# Patient Record
Sex: Female | Born: 1960 | Race: White | Hispanic: No | Marital: Married | Smoking: Never smoker
Health system: Southern US, Community
[De-identification: ages and names within clinical notes are randomized; demographics above are authoritative.]

## PROBLEM LIST (undated history)

## (undated) ENCOUNTER — Ambulatory Visit: Payer: No Typology Code available for payment source

## (undated) DIAGNOSIS — K219 Gastro-esophageal reflux disease without esophagitis: Secondary | ICD-10-CM

## (undated) DIAGNOSIS — I10 Essential (primary) hypertension: Secondary | ICD-10-CM

## (undated) HISTORY — PX: ABDOMINAL HYSTERECTOMY: SHX81

---

## 2005-02-07 ENCOUNTER — Ambulatory Visit: Payer: Self-pay | Admitting: Internal Medicine

## 2006-06-12 ENCOUNTER — Ambulatory Visit: Payer: Self-pay | Admitting: Internal Medicine

## 2007-09-03 ENCOUNTER — Ambulatory Visit: Payer: Self-pay | Admitting: Internal Medicine

## 2008-02-18 ENCOUNTER — Ambulatory Visit: Payer: Self-pay | Admitting: Unknown Physician Specialty

## 2008-11-17 ENCOUNTER — Ambulatory Visit: Payer: Self-pay | Admitting: Internal Medicine

## 2009-12-14 ENCOUNTER — Ambulatory Visit: Payer: Self-pay | Admitting: Internal Medicine

## 2011-06-02 ENCOUNTER — Ambulatory Visit: Payer: Self-pay | Admitting: Internal Medicine

## 2011-12-12 ENCOUNTER — Ambulatory Visit: Payer: Self-pay | Admitting: Internal Medicine

## 2012-05-13 ENCOUNTER — Ambulatory Visit: Payer: Self-pay | Admitting: Podiatry

## 2013-02-18 ENCOUNTER — Ambulatory Visit: Payer: Self-pay

## 2013-04-22 ENCOUNTER — Ambulatory Visit: Payer: Self-pay | Admitting: Medical

## 2013-04-22 LAB — URINALYSIS, COMPLETE
Bilirubin,UR: NEGATIVE
Glucose,UR: NEGATIVE mg/dL (ref 0–75)
Ketone: NEGATIVE
Nitrite: NEGATIVE
WBC UR: >30 /HPF (ref 0–5)

## 2013-04-24 LAB — URINE CULTURE

## 2013-07-22 ENCOUNTER — Ambulatory Visit: Payer: Self-pay | Admitting: Unknown Physician Specialty

## 2013-07-25 LAB — PATHOLOGY REPORT

## 2014-05-26 ENCOUNTER — Ambulatory Visit: Payer: Self-pay

## 2016-02-19 ENCOUNTER — Other Ambulatory Visit: Payer: Self-pay | Admitting: Infectious Diseases

## 2016-02-19 DIAGNOSIS — Z1231 Encounter for screening mammogram for malignant neoplasm of breast: Secondary | ICD-10-CM

## 2016-03-14 ENCOUNTER — Encounter (HOSPITAL_COMMUNITY): Payer: Self-pay

## 2016-03-14 ENCOUNTER — Ambulatory Visit
Admission: RE | Admit: 2016-03-14 | Discharge: 2016-03-14 | Disposition: A | Discharge status: Home / Self Care | Payer: Managed Care, Other (non HMO) | Source: Ambulatory Visit | Attending: Infectious Diseases | Admitting: Infectious Diseases

## 2016-03-14 DIAGNOSIS — Z1231 Encounter for screening mammogram for malignant neoplasm of breast: Secondary | ICD-10-CM | POA: Diagnosis not present

## 2017-03-11 ENCOUNTER — Other Ambulatory Visit: Payer: Self-pay | Admitting: Infectious Diseases

## 2017-03-11 DIAGNOSIS — Z1231 Encounter for screening mammogram for malignant neoplasm of breast: Secondary | ICD-10-CM

## 2017-04-17 ENCOUNTER — Ambulatory Visit
Admission: RE | Admit: 2017-04-17 | Discharge: 2017-04-17 | Disposition: A | Discharge status: Home / Self Care | Payer: Managed Care, Other (non HMO) | Source: Ambulatory Visit | Attending: Infectious Diseases | Admitting: Infectious Diseases

## 2017-04-17 DIAGNOSIS — Z1231 Encounter for screening mammogram for malignant neoplasm of breast: Secondary | ICD-10-CM | POA: Diagnosis not present

## 2018-04-13 ENCOUNTER — Other Ambulatory Visit: Payer: Self-pay | Admitting: Internal Medicine

## 2018-04-13 DIAGNOSIS — Z1231 Encounter for screening mammogram for malignant neoplasm of breast: Secondary | ICD-10-CM

## 2018-05-07 ENCOUNTER — Ambulatory Visit
Admission: RE | Admit: 2018-05-07 | Discharge: 2018-05-07 | Disposition: A | Discharge status: Home / Self Care | Payer: Managed Care, Other (non HMO) | Source: Ambulatory Visit | Attending: Internal Medicine | Admitting: Internal Medicine

## 2018-05-07 DIAGNOSIS — Z1231 Encounter for screening mammogram for malignant neoplasm of breast: Secondary | ICD-10-CM | POA: Diagnosis not present

## 2019-04-01 ENCOUNTER — Other Ambulatory Visit: Payer: Self-pay | Admitting: Internal Medicine

## 2019-04-01 DIAGNOSIS — Z1231 Encounter for screening mammogram for malignant neoplasm of breast: Secondary | ICD-10-CM

## 2019-05-10 ENCOUNTER — Ambulatory Visit
Admission: RE | Admit: 2019-05-10 | Discharge: 2019-05-10 | Disposition: A | Discharge status: Home / Self Care | Payer: Managed Care, Other (non HMO) | Source: Ambulatory Visit | Attending: Internal Medicine | Admitting: Internal Medicine

## 2019-05-10 DIAGNOSIS — Z1231 Encounter for screening mammogram for malignant neoplasm of breast: Secondary | ICD-10-CM | POA: Diagnosis present

## 2019-05-19 ENCOUNTER — Other Ambulatory Visit: Payer: Self-pay | Admitting: Physician Assistant

## 2019-05-19 DIAGNOSIS — R1011 Right upper quadrant pain: Secondary | ICD-10-CM

## 2019-05-27 ENCOUNTER — Other Ambulatory Visit: Payer: Self-pay

## 2019-05-27 ENCOUNTER — Ambulatory Visit
Admission: RE | Admit: 2019-05-27 | Discharge: 2019-05-27 | Disposition: A | Discharge status: Home / Self Care | Payer: No Typology Code available for payment source | Source: Ambulatory Visit | Attending: Physician Assistant | Admitting: Physician Assistant

## 2019-05-27 DIAGNOSIS — R1011 Right upper quadrant pain: Secondary | ICD-10-CM | POA: Insufficient documentation

## 2019-10-01 ENCOUNTER — Other Ambulatory Visit
Admission: RE | Admit: 2019-10-01 | Discharge: 2019-10-01 | Disposition: A | Discharge status: Home / Self Care | Payer: 59 | Source: Ambulatory Visit | Attending: Student | Admitting: Student

## 2019-10-01 DIAGNOSIS — K5150 Left sided colitis without complications: Secondary | ICD-10-CM | POA: Insufficient documentation

## 2019-10-04 LAB — CALPROTECTIN, FECAL: Calprotectin, Fecal: 43 ug/g (ref 0–120)

## 2020-06-18 ENCOUNTER — Other Ambulatory Visit: Payer: Self-pay | Admitting: Infectious Diseases

## 2020-06-18 DIAGNOSIS — Z1231 Encounter for screening mammogram for malignant neoplasm of breast: Secondary | ICD-10-CM

## 2020-07-06 ENCOUNTER — Ambulatory Visit
Admission: RE | Admit: 2020-07-06 | Discharge: 2020-07-06 | Disposition: A | Discharge status: Home / Self Care | Payer: No Typology Code available for payment source | Source: Ambulatory Visit | Attending: Infectious Diseases | Admitting: Infectious Diseases

## 2020-07-06 ENCOUNTER — Other Ambulatory Visit: Payer: Self-pay

## 2020-07-06 DIAGNOSIS — Z1231 Encounter for screening mammogram for malignant neoplasm of breast: Secondary | ICD-10-CM | POA: Insufficient documentation

## 2020-12-28 ENCOUNTER — Ambulatory Visit
Admission: EM | Admit: 2020-12-28 | Discharge: 2020-12-28 | Disposition: A | Discharge status: Home / Self Care | Payer: No Typology Code available for payment source | Attending: Family Medicine | Admitting: Family Medicine

## 2020-12-28 ENCOUNTER — Other Ambulatory Visit: Payer: Self-pay

## 2020-12-28 ENCOUNTER — Encounter: Payer: Self-pay | Admitting: Emergency Medicine

## 2020-12-28 DIAGNOSIS — R21 Rash and other nonspecific skin eruption: Secondary | ICD-10-CM | POA: Diagnosis not present

## 2020-12-28 HISTORY — DX: Essential (primary) hypertension: I10

## 2020-12-28 HISTORY — DX: Gastro-esophageal reflux disease without esophagitis: K21.9

## 2020-12-28 MED ORDER — HYDROXYZINE HCL 25 MG PO TABS: 25.0000 mg | ORAL_TABLET | Freq: Three times a day (TID) | ORAL | 0 refills | Status: AC | PRN

## 2020-12-28 MED ORDER — PREDNISONE 10 MG PO TABS: ORAL_TABLET | ORAL | 0 refills | Status: AC

## 2020-12-28 NOTE — ED Provider Notes (Signed)
[facility]    CSN: [id number] Arrival date & time: 12/28/20  1148      History   Chief Complaint Chief Complaint  Patient presents with   Rash    HPI 60 year old female presents with rash.  Patient has had intermittent rash since February of this year.  She has been evaluated by dermatology as well as allergy.  She has been prescribed steroids on 3 occasions with improvement but no complete resolution.  She has stopped medication as well as changed her care products without resolution.  She states that she has had recurrent rash again since Sunday or Monday of this week.  It is located predominantly on the back.  It is very pruritic and is raised and erythematous.  Patient reports diffuse itching.  She continues to t take Zyrtec and Benadryl without resolution.  She is very perplexed by her symptoms and is very uncomfortable.  She is unhappy that this continues to be a problem despite all of her interventions and seeing an allergist and dermatologist.    Past Medical History:  Diagnosis Date   GERD (gastroesophageal reflux disease)    Hypertension    Past Surgical History:  Procedure Laterality Date   ABDOMINAL HYSTERECTOMY      OB History   No obstetric history on file.      Home Medications    Prior to Admission medications   Medication Sig Start Date End Date Taking? Authorizing Provider  Cetirizine HCl 10 MG CAPS Take by mouth.   Yes [provider]  hydrOXYzine (ATARAX/VISTARIL) 25 MG tablet Take 1 tablet (25 mg total) by mouth every 8 (eight) hours as needed. 12/28/20  Yes Tommie Sams, DO  Multiple Vitamin (MULTIVITAMIN) capsule Take 1 capsule by mouth daily.   Yes [provider]  olmesartan-hydrochlorothiazide (BENICAR HCT) 20-12.5 MG tablet Take 1 tablet by mouth daily. 12/25/20  Yes [provider]  omeprazole (PRILOSEC) 20 MG capsule Take by mouth. 06/08/14  Yes [provider]  predniSONE (DELTASONE) 10 MG tablet  50 mg daily x 3 days, then 40 mg daily x 3 days, then 30 mg daily x 3 days, then 20 mg daily x 3 days, then 10 mg daily x 3 days. 12/28/20  Yes Jacksen Isip G, DO  sertraline (ZOLOFT) 25 MG tablet Take 25 mg by mouth daily. 10/12/20  Yes [provider]    Family History Family History  Problem Relation Age of Onset   Breast cancer Neg Hx     Social History Social History   Tobacco Use   Smoking status: Never   Smokeless tobacco: Never  Vaping Use   Vaping Use: Never used  Substance Use Topics   Alcohol use: Yes   Drug use: Never     Allergies   Patient has no known allergies.   Review of Systems Review of Systems  Constitutional: Negative.   Skin:  Positive for rash.   Physical Exam Triage Vital Signs ED Triage Vitals  Enc Vitals Group     BP 12/28/20 1202 139/83     Pulse Rate 12/28/20 1202 74     Resp 12/28/20 1202 14     Temp 12/28/20 1202 97.6 F (36.4 C)     Temp Source 12/28/20 1202 Oral     SpO2 12/28/20 1202 98 %     Weight 12/28/20 1158 140 lb (63.5 kg)     Height 12/28/20 1158 5\' 3"  (1.6 m)     Head Circumference --  Peak Flow --      Pain Score 12/28/20 1158 0     Pain Loc --      Pain Edu? --      Excl. in GC? --    Updated Vital Signs BP 139/83 (BP Location: Left Arm)   Pulse 74   Temp 97.6 F (36.4 C) (Oral)   Resp 14   Ht 5\' 3"  (1.6 m)   Wt 63.5 kg   SpO2 98%   BMI 24.80 kg/m   Visual Acuity Right Eye Distance:   Left Eye Distance:   Bilateral Distance:    Right Eye Near:   Left Eye Near:    Bilateral Near:     Physical Exam Constitutional:      General: She is not in acute distress.    Appearance: Normal appearance. She is not ill-appearing.  HENT:     Head: Normocephalic and atraumatic.  Pulmonary:     Effort: Pulmonary effort is normal. No respiratory distress.  Skin:    Comments: Upper back with diffuse erythematous papular rash.  Neurological:     Mental Status: She is alert.  Psychiatric:         Mood and Affect: Mood normal.        Behavior: Behavior normal.     UC Treatments / Results  Labs (all labs ordered are listed, but only abnormal results are displayed) Labs Reviewed - No data to display  EKG   Radiology No results found.  Procedures Procedures (including critical care time)  Medications Ordered in UC Medications - No data to display  Initial Impression / Assessment and Plan / UC Course  I have reviewed the triage vital signs and the nursing notes.  Pertinent labs & imaging results that were available during my care of the patient were reviewed by me and considered in my medical decision making (see chart for details).    60 year old female presents with ongoing, intermittent rash.  Her exam today is notable for an erythematous, raised rash throughout her upper back.  It is very pruritic.  Placing on 15-day prednisone taper.  Advised Allegra daily.  Atarax as prescribed.  Information given for dermatology.  She needs a biopsy for further evaluation.  Final Clinical Impressions(s) / UC Diagnoses   Final diagnoses:  Rash     Discharge Instructions      Medication as prescribed.  Call [facility] for an appt. You need a biopsy of the rash. [phone]  Take care  Dr. [phone]    ED Prescriptions     Medication Sig Dispense Auth. Provider   predniSONE (DELTASONE) 10 MG tablet 50 mg daily x 3 days, then 40 mg daily x 3 days, then 30 mg daily x 3 days, then 20 mg daily x 3 days, then 10 mg daily x 3 days. 45 tablet Josip Merolla G, DO   hydrOXYzine (ATARAX/VISTARIL) 25 MG tablet Take 1 tablet (25 mg total) by mouth every 8 (eight) hours as needed. 30 tablet 06-23-2003, DO      PDMP not reviewed this encounter.   Tommie Sams, Tommie Sams 12/28/20 1323

## 2020-12-28 NOTE — Discharge Instructions (Addendum)
Medication as prescribed.  Call [facility] for an appt. You need a biopsy of the rash. [phone]  Take care  Dr. Adriana Simas

## 2020-12-28 NOTE — ED Triage Notes (Signed)
Patient reports rash that started on her back over a week ago.  Patient reports itching all over.

## 2021-08-01 ENCOUNTER — Other Ambulatory Visit: Payer: Self-pay | Admitting: Infectious Diseases

## 2021-09-13 ENCOUNTER — Ambulatory Visit
Admission: RE | Admit: 2021-09-13 | Discharge: 2021-09-13 | Disposition: A | Discharge status: Home / Self Care | Payer: No Typology Code available for payment source | Source: Ambulatory Visit | Attending: Infectious Diseases | Admitting: Infectious Diseases

## 2021-09-13 DIAGNOSIS — Z1231 Encounter for screening mammogram for malignant neoplasm of breast: Secondary | ICD-10-CM | POA: Insufficient documentation

## 2022-08-20 ENCOUNTER — Other Ambulatory Visit: Payer: Self-pay

## 2022-08-20 DIAGNOSIS — Z1231 Encounter for screening mammogram for malignant neoplasm of breast: Secondary | ICD-10-CM

## 2022-09-19 ENCOUNTER — Ambulatory Visit
Admission: RE | Admit: 2022-09-19 | Discharge: 2022-09-19 | Disposition: A | Discharge status: Home / Self Care | Payer: No Typology Code available for payment source | Source: Ambulatory Visit | Attending: Infectious Diseases | Admitting: Infectious Diseases

## 2022-09-19 DIAGNOSIS — Z1231 Encounter for screening mammogram for malignant neoplasm of breast: Secondary | ICD-10-CM | POA: Insufficient documentation

## 2023-10-27 ENCOUNTER — Other Ambulatory Visit: Payer: Self-pay | Admitting: Infectious Diseases

## 2023-10-27 DIAGNOSIS — Z1231 Encounter for screening mammogram for malignant neoplasm of breast: Secondary | ICD-10-CM

## 2023-11-06 ENCOUNTER — Ambulatory Visit
Admission: RE | Admit: 2023-11-06 | Discharge: 2023-11-06 | Disposition: A | Discharge status: Home / Self Care | Source: Ambulatory Visit | Attending: Infectious Diseases | Admitting: Infectious Diseases

## 2023-11-06 DIAGNOSIS — Z1231 Encounter for screening mammogram for malignant neoplasm of breast: Secondary | ICD-10-CM | POA: Diagnosis present

## 2024-01-02 IMAGING — MG MM DIGITAL SCREENING BILAT W/ TOMO AND CAD
8 series · 9 of 24 positions shown · non-contrast
Comparison: Previous exam(s).

CLINICAL DATA: Screening.

EXAM:
DIGITAL SCREENING BILATERAL MAMMOGRAM WITH TOMOSYNTHESIS AND CAD
TECHNIQUE: Bilateral screening digital craniocaudal and mediolateral oblique
mammograms were obtained. Bilateral screening digital breast
tomosynthesis was performed. The images were evaluated with
computer-aided detection.

[R CC synth-2D]
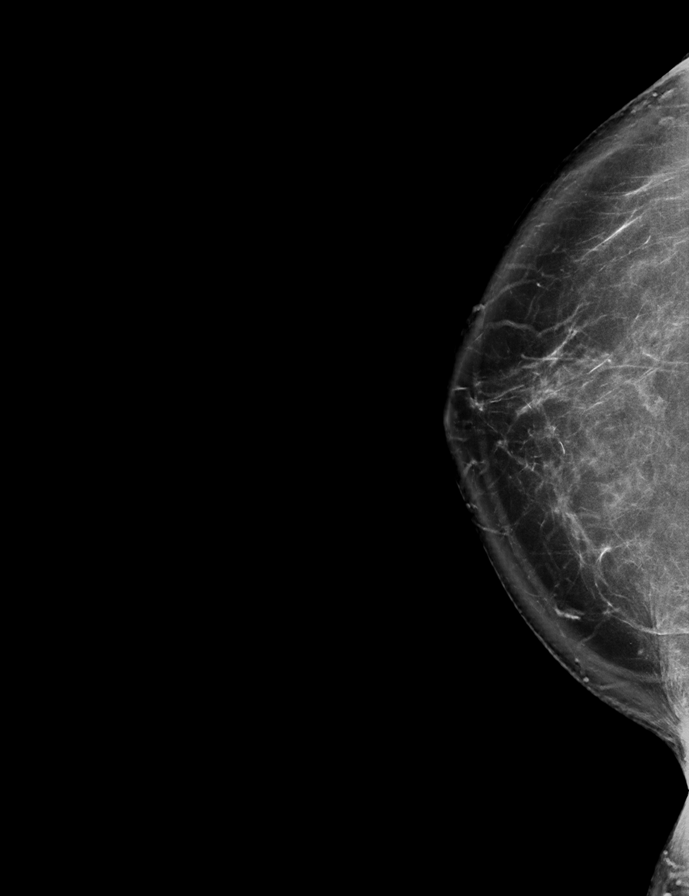

[R MLO synth-2D]
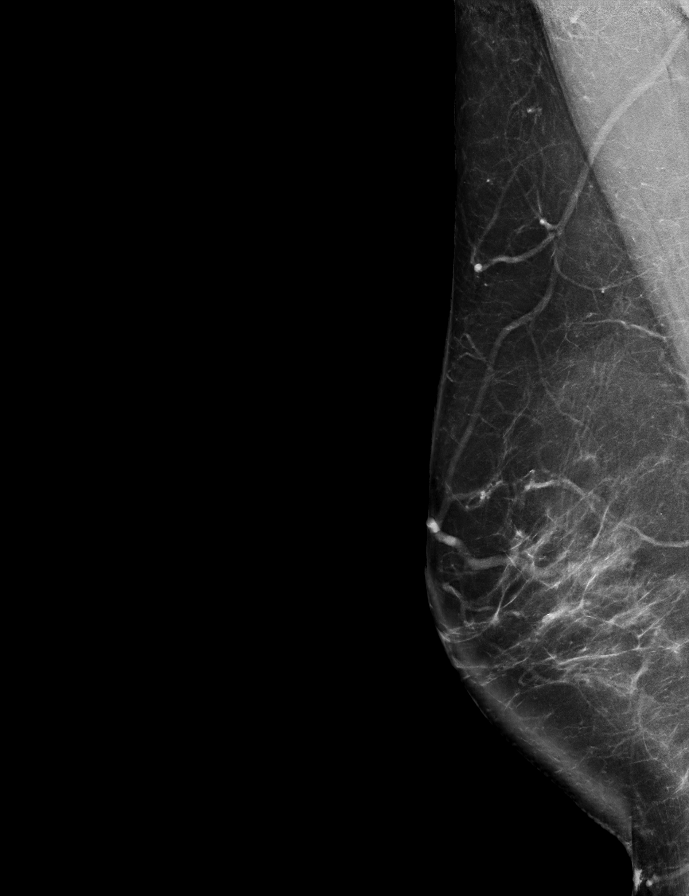

[L MLO synth-2D]
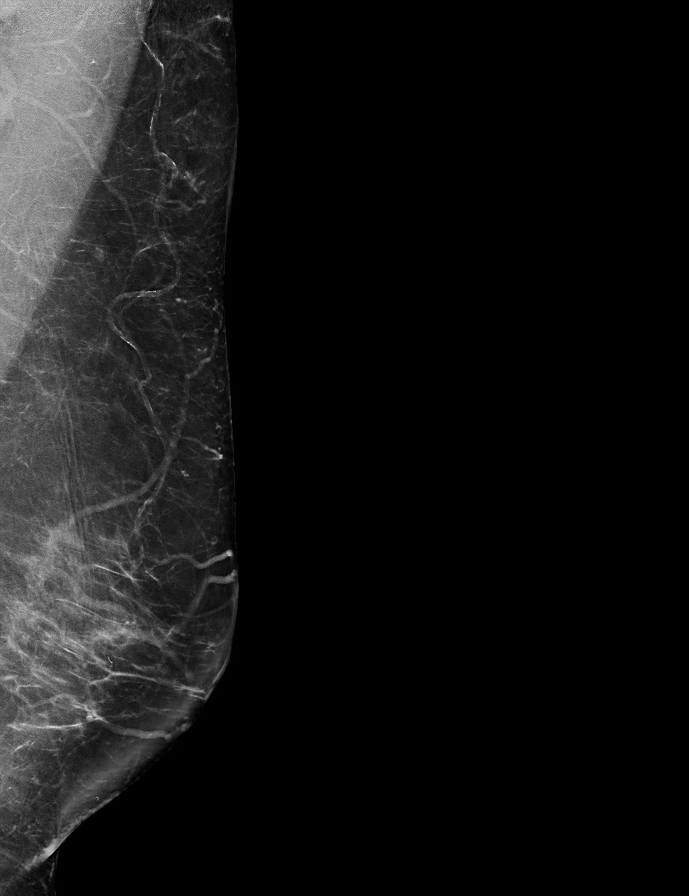

[L CC synth-2D]
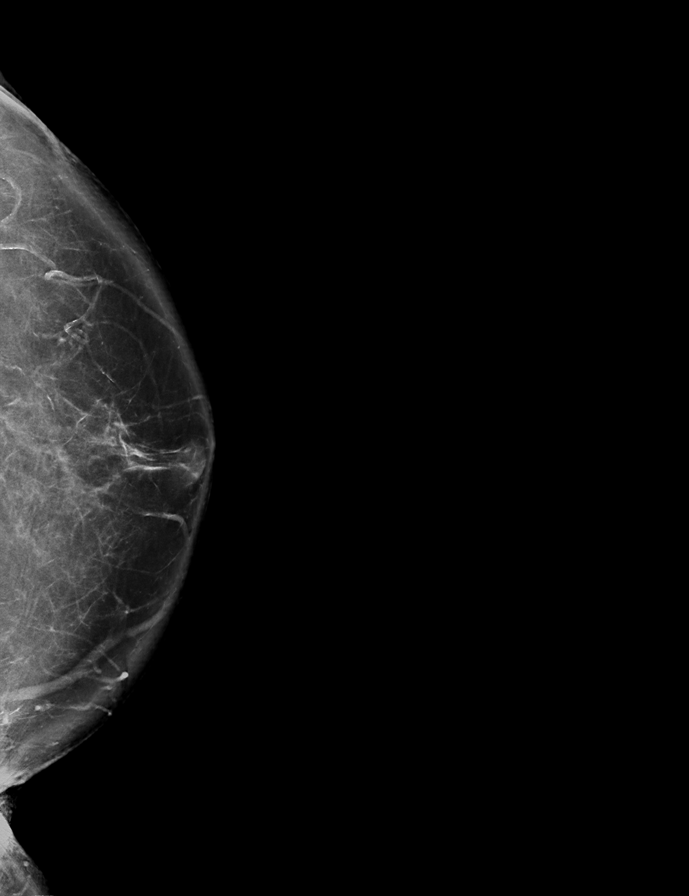

[R CC tomo · 2 of 90 frames shown]
[frame 29/90]
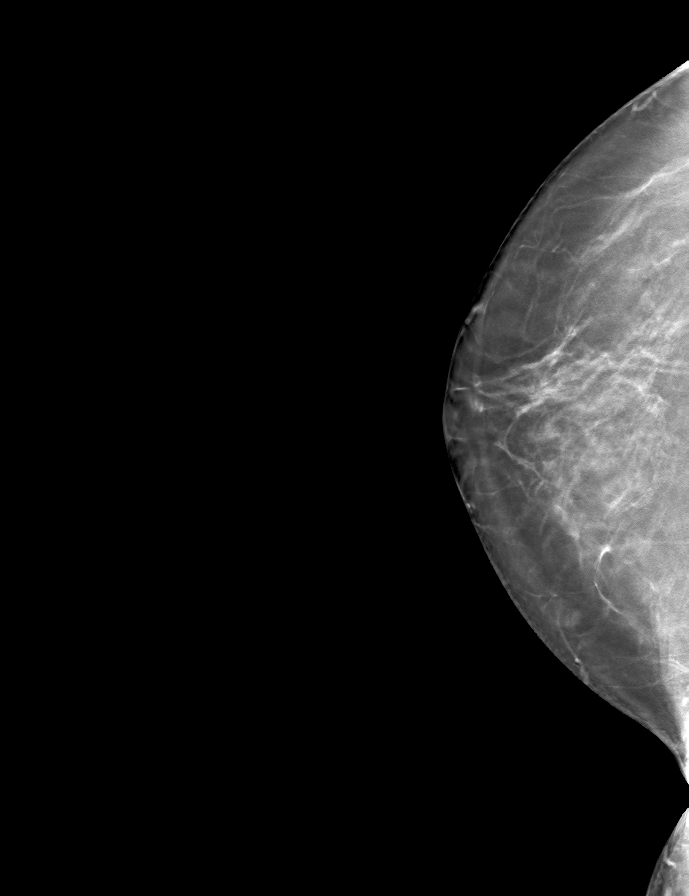
[frame 45/90]
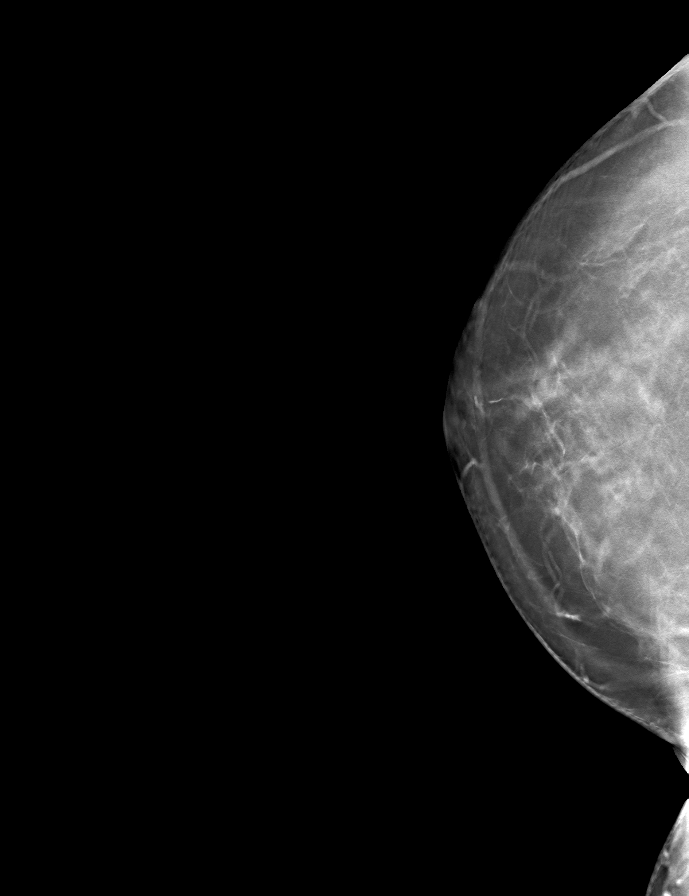

[L CC tomo · tomo slice 46/91.0]
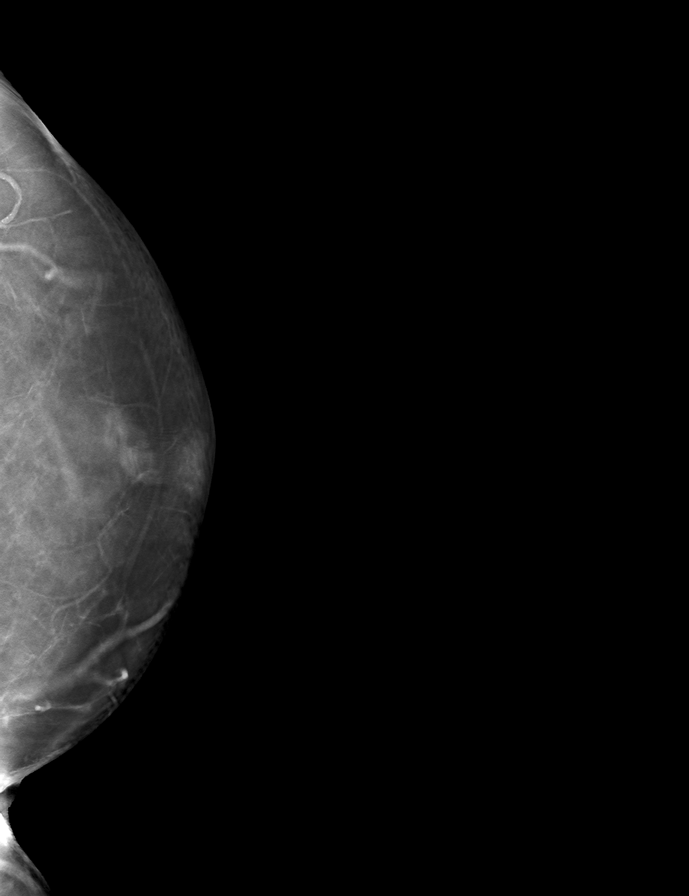

[R MLO tomo · tomo slice 45/89.0]
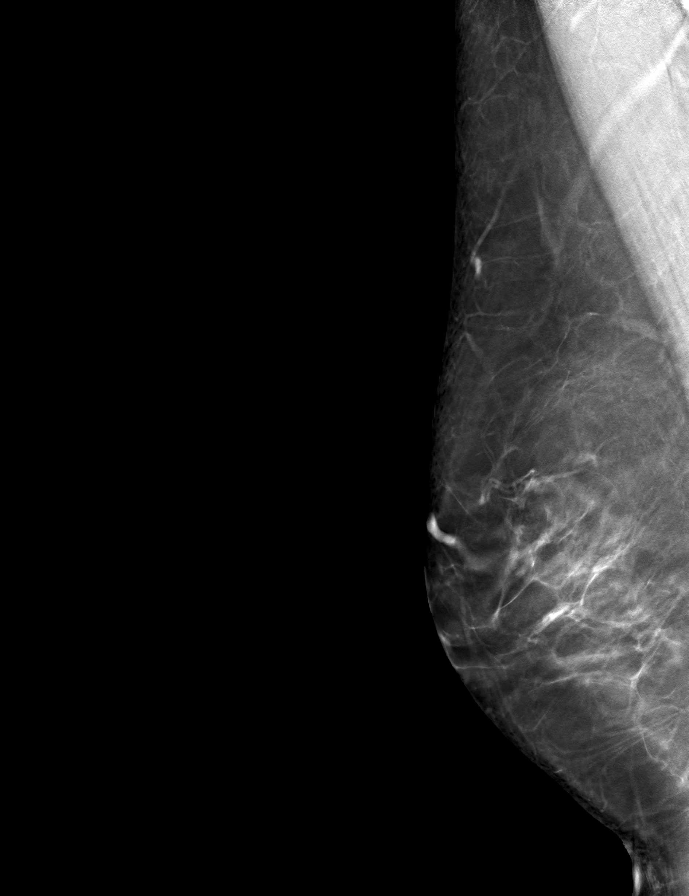

[L MLO tomo · tomo slice 43/85.0]
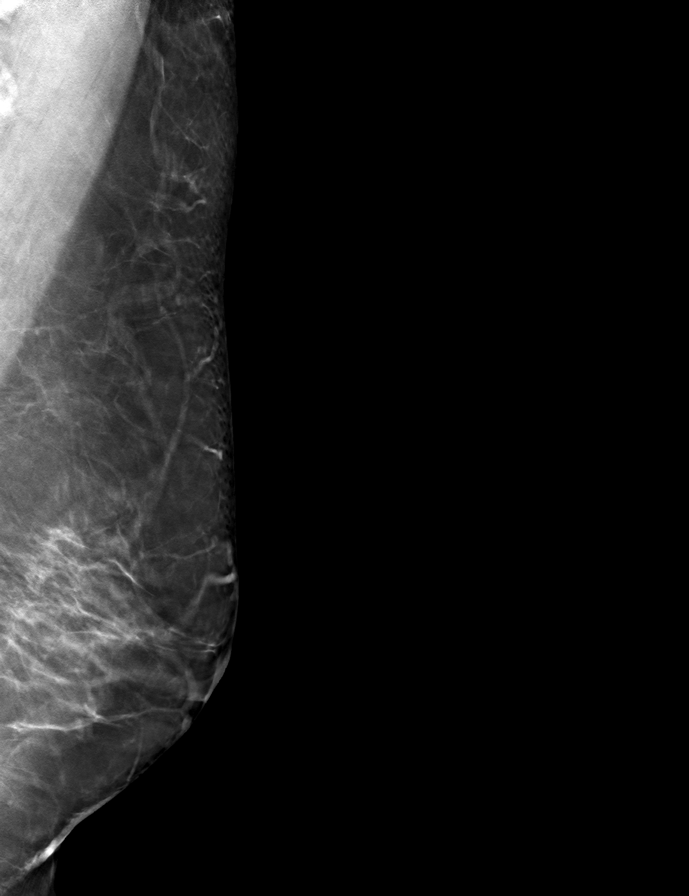

[9 of 24 positions shown; findings below may reference images not displayed]

ACR Breast Density Category b: There are scattered areas of
fibroglandular density.
FINDINGS: There are no findings suspicious for malignancy.
IMPRESSION: No mammographic evidence of malignancy. A result letter of this
screening mammogram will be mailed directly to the patient.

RECOMMENDATION:
Screening mammogram in one year. (Code:51-O-LD2)

BI-RADS CATEGORY  1: Negative.

## 2024-01-14 ENCOUNTER — Other Ambulatory Visit: Payer: Self-pay | Admitting: Infectious Diseases

## 2024-01-14 DIAGNOSIS — L501 Idiopathic urticaria: Secondary | ICD-10-CM

## 2024-01-14 DIAGNOSIS — Z0000 Encounter for general adult medical examination without abnormal findings: Secondary | ICD-10-CM

## 2024-01-14 DIAGNOSIS — K5150 Left sided colitis without complications: Secondary | ICD-10-CM

## 2024-01-14 DIAGNOSIS — Z1331 Encounter for screening for depression: Secondary | ICD-10-CM

## 2024-01-14 DIAGNOSIS — I10 Essential (primary) hypertension: Secondary | ICD-10-CM

## 2024-01-14 DIAGNOSIS — L239 Allergic contact dermatitis, unspecified cause: Secondary | ICD-10-CM

## 2024-01-29 ENCOUNTER — Ambulatory Visit
Admission: RE | Admit: 2024-01-29 | Discharge: 2024-01-29 | Disposition: A | Discharge status: Home / Self Care | Payer: Self-pay | Source: Ambulatory Visit | Attending: Infectious Diseases | Admitting: Infectious Diseases

## 2024-01-29 DIAGNOSIS — Z0000 Encounter for general adult medical examination without abnormal findings: Secondary | ICD-10-CM | POA: Insufficient documentation

## 2024-01-29 DIAGNOSIS — L239 Allergic contact dermatitis, unspecified cause: Secondary | ICD-10-CM | POA: Insufficient documentation

## 2024-01-29 DIAGNOSIS — Z1331 Encounter for screening for depression: Secondary | ICD-10-CM | POA: Insufficient documentation

## 2024-01-29 DIAGNOSIS — I10 Essential (primary) hypertension: Secondary | ICD-10-CM | POA: Insufficient documentation

## 2024-01-29 DIAGNOSIS — L501 Idiopathic urticaria: Secondary | ICD-10-CM | POA: Insufficient documentation

## 2024-01-29 DIAGNOSIS — K5150 Left sided colitis without complications: Secondary | ICD-10-CM | POA: Insufficient documentation

## 2024-07-21 ENCOUNTER — Ambulatory Visit: Admit: 2024-07-21
